# Patient Record
Sex: Female | Born: 1995 | Race: White | Hispanic: No | Marital: Single | Smoking: Former smoker
Health system: Southern US, Community
[De-identification: ages and names within clinical notes are randomized; demographics above are authoritative.]

## PROBLEM LIST (undated history)

## (undated) ENCOUNTER — Ambulatory Visit (HOSPITAL_COMMUNITY): Disposition: A | Discharge status: Home / Self Care | Payer: No Typology Code available for payment source

## (undated) DIAGNOSIS — Z8759 Personal history of other complications of pregnancy, childbirth and the puerperium: Secondary | ICD-10-CM

## (undated) DIAGNOSIS — O139 Gestational [pregnancy-induced] hypertension without significant proteinuria, unspecified trimester: Secondary | ICD-10-CM

## (undated) DIAGNOSIS — R110 Nausea: Secondary | ICD-10-CM

## (undated) DIAGNOSIS — Z87440 Personal history of urinary (tract) infections: Secondary | ICD-10-CM

## (undated) DIAGNOSIS — I10 Essential (primary) hypertension: Secondary | ICD-10-CM

## (undated) DIAGNOSIS — R109 Unspecified abdominal pain: Secondary | ICD-10-CM

## (undated) DIAGNOSIS — D473 Essential (hemorrhagic) thrombocythemia: Secondary | ICD-10-CM

## (undated) HISTORY — DX: Personal history of urinary (tract) infections: Z87.440

## (undated) HISTORY — DX: Essential (hemorrhagic) thrombocythemia: D47.3

## (undated) HISTORY — DX: Personal history of other complications of pregnancy, childbirth and the puerperium: Z87.59

## (undated) HISTORY — DX: Unspecified abdominal pain: R10.9

## (undated) HISTORY — DX: Nausea: R11.0

---
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
## 2017-01-01 ENCOUNTER — Telehealth: Payer: Self-pay | Admitting: Radiology

## 2017-01-01 ENCOUNTER — Other Ambulatory Visit: Payer: 59

## 2017-01-01 LAB — TSH: TSH: 1.874 u[IU]/mL (ref 0.350–4.500)

## 2017-01-01 NOTE — Progress Notes (Signed)
POSTPARTUM PROGRESS NOTE  Post Partum Day 1 Subjective:  Jessicaann B Dillard is a 21 y.o. G1P1001 [redacted]w[redacted]d s/p SVD.  No acute events overnight.  Pt denies problems with ambulating.  She denies nausea or vomiting, but did not have an appetite yesterday, though is feeling hungry now. She has tolerated po fluids well. Pain is well controlled, she is experiencing abdominal cramping but is otherwise well.  She has not had flatus or a bowel movement since giving birth, but has been able to urinate.  Lochia Moderate, per patient this is heavier than her normal menstrual flow.   Objective: Blood pressure 127/69, pulse (!) 111, temperature 98.3 F (36.8 C), temperature source Oral, resp. rate 18, height 5\' 4"  (1.626 m), weight (!) 141.1 kg (311 lb), last menstrual period 03/25/2016, SpO2 99 %, unknown if currently breastfeeding.  Physical Exam:  General: alert, cooperative and no distress Lochia: moderate flow Chest: no respiratory distress Heart:regular rate, distal pulses intact Abdomen: soft, nontender,  Uterine Fundus: firm, appropriately tender DVT Evaluation: No calf swelling or tenderness Extremities: Positive for LE edema  Recent Labs    12/30/16 1114 12/31/16 0625  HGB 10.6* 10.7*  HCT 31.3* 32.5*    Assessment/Plan:  ASSESSMENT: Sirenity B Bubeck is a 21 y.o. G1P1001 [redacted]w[redacted]d s/p SVD who is doing well today. Her heart rate has been elevated throughout admission, which may be secondary to anemia. As the tachycardia was present before delivery, I am less concerned about acute blood loss as the etiology. The patient has a normocytic anemia with normal RDW on CBC, making iron deficiency less likely. On chart review, it appears as though her heart rate has been found to be consistently elevated. We will check a TSH as patient also endorses history of palpitations.   We will plan for the patient to be discharged tomorrow if she continues to do well.    LOS: 1 day   Senaida Lange,  MS3 01/01/2017, 9:12 AM

## 2017-01-01 NOTE — Anesthesia Postprocedure Evaluation (Signed)
Anesthesia Post Note  Patient: Ashley Cisneros  Procedure(s) Performed: AN AD HOC LABOR EPIDURAL     Patient location during evaluation: Mother Baby Anesthesia Type: Epidural Level of consciousness: awake and alert, oriented and patient cooperative Pain management: pain level controlled Vital Signs Assessment: post-procedure vital signs reviewed and stable Respiratory status: spontaneous breathing Cardiovascular status: stable Postop Assessment: no headache, epidural receding, patient able to bend at knees and no signs of nausea or vomiting Anesthetic complications: no Comments: Pain score 4.    Last Vitals:  Vitals:   12/31/16 2351 01/01/17 0531  BP: 126/71 127/69  Pulse: (!) 108 (!) 111  Resp: 18 18  Temp: 36.7 C 36.8 C  SpO2: 99%     Last Pain:  Vitals:   01/01/17 0705  TempSrc:   PainSc: 1    Pain Goal: Patients Stated Pain Goal: 0 (01/01/17 0705)               Rico Sheehan

## 2017-01-01 NOTE — Lactation Note (Signed)
This note was copied from a baby's chart. Lactation Consultation Note  Patient Name: Ashley Cisneros Today's Date: 01/01/2017 Reason for consult: Follow-up assessment;Mother's request;Term   Follow up with mom of 3 hour old infant. Infant with 4 BF , 6 BF attempts, 2 voids and 5 stools in the last 24 hours. Infant weight 7 lb 4.4 oz with 2% weight loss since birth. LATCH scores 6-7.   Mom is concerned with infant feeding. Infant was latched and was noted to be shallowly latched. Mom's nipple was asymmetrical when infant came off. Assisted mom with deepening latch and mom reports it felt much better. Infant fed off and on for 15 minutes and was still feeding when [place] left room. Enc mom to massage/compress breast with feeding. Infant was noted to have intermittent swallows with feeding. Enc mom to use awakening techniques with feeding.   Enc mom to call out for feeding assistance as needed. Mom reports she has no further questions/concerns at this time.    Maternal Data Formula Feeding for Exclusion: No Has patient been taught Hand Expression?: Yes Does the patient have breastfeeding experience prior to this delivery?: No  Feeding Feeding Type: Breast Fed Length of feed: 15 min(Still Bf when LC left room)  LATCH Score Latch: Repeated attempts needed to sustain latch, nipple held in mouth throughout feeding, stimulation needed to elicit sucking reflex.  Audible Swallowing: A few with stimulation  Type of Nipple: Everted at rest and after stimulation  Comfort (Breast/Nipple): Soft / non-tender  Hold (Positioning): Assistance needed to correctly position infant at breast and maintain latch.  LATCH Score: 7  Interventions Interventions: Breast feeding basics reviewed;Support pillows;Assisted with latch;Position options;Skin to skin;Expressed milk;Reverse pressure;Breast massage  Lactation Tools Discussed/Used     Consult Status Consult Status: Follow-up Date:  01/02/17 Follow-up type: In-patient    Debby Freiberg Brick Ketcher 01/01/2017, 4:32 PM

## 2017-01-01 NOTE — Telephone Encounter (Signed)
Left message on the cell phone voicemail about appointments scheduled on 01/07/17 @ 10:00 BP Check and 02/05/16 @ 9:00 Postpartum, patient instructed to call [id number] if times and dates don't work for her.

## 2017-01-01 NOTE — Progress Notes (Signed)
Pt called out says she was bleeding and in pain. RN assessed fundus, WNL. Pt reports passing a golfball sized clot when using the bathroom last time and the blood was a lot heavier than before, "dripping on the floor", per pt. RN did not notice any abnormal finding during additional fundal check. Pt given PRN Tylenol and heat packs for cramping. Pt encouraged to empty bladder often.

## 2017-01-02 MED ORDER — IBUPROFEN 800 MG PO TABS: 800.0000 mg | ORAL_TABLET | Freq: Three times a day (TID) | ORAL | 2 refills | Status: DC | PRN

## 2017-01-02 MED ORDER — FERROUS FUMARATE 325 (106 FE) MG PO TABS: 1.0000 | ORAL_TABLET | Freq: Two times a day (BID) | ORAL | 2 refills | Status: DC

## 2017-01-02 MED ORDER — POLYSACCHARIDE IRON COMPLEX 150 MG PO CAPS
150.0000 mg | ORAL_CAPSULE | Freq: Two times a day (BID) | ORAL | Status: DC
  Filled 2017-01-02: qty 1

## 2017-01-02 NOTE — Lactation Note (Signed)
This note was copied from a baby's chart. Lactation Consultation Note  Patient Name: Ashley Cisneros Today's Date: 01/02/2017  Baby at a 7% weight loss.  Mom c/o sore nipples.  Nipples pink and intact.  Mom may want to rest nipples because baby has been cluster feeding.  Discussed formula and volume parameters if she decides to supplement some feeds.  She has a DEBP at home.  Stressed importance of pumping if she supplements.  Comfort gels given with instructions.  Mom is motivated.  Hopefully nipples will improve as milk comes to volume.  Lactation outpatient services and support information reviewed and encouraged.   Maternal Data    Feeding    LATCH Score                   Interventions    Lactation Tools Discussed/Used     Consult Status      Ave Filter 01/02/2017, 8:13 AM

## 2017-01-02 NOTE — Progress Notes (Signed)
Post Partum Day #2 Subjective: no complaints, up ad lib, voiding, tolerating PO and + flatus  Objective: Blood pressure 138/84, pulse (!) 101, temperature 98.1 F (36.7 C), temperature source Oral, resp. rate 18, height 5\' 4"  (1.626 m), weight (!) 311 lb (141.1 kg), last menstrual period 03/25/2016, SpO2 99 %, unknown if currently breastfeeding.  Physical Exam:  General: alert, cooperative and no distress Lochia: appropriate Uterine Fundus: firm Incision: none DVT Evaluation: No evidence of DVT seen on physical exam. No cords or calf tenderness. No significant calf/ankle edema.  Recent Labs    12/30/16 1114 12/31/16 0625  HGB 10.6* 10.7*  HCT 31.3* 32.5*    Assessment/Plan: Discharge home, Breastfeeding and Contraception POP or Nexplanon; does not want in hospital.    Mild anemia: iron started.    LOS: 2 days   [name], CNM 01/02/2017, 5:56 AM

## 2017-01-02 NOTE — Discharge Summary (Signed)
OB Discharge Summary     Patient Name: Ashley Cisneros DOB: 1995/11/12 MRN: [id number]  Date of admission: 12/31/2016 Delivering MD: Senaida Lange   Date of discharge: 01/02/2017  Admitting diagnosis: 68 WEEKS CTX Intrauterine pregnancy: [redacted]w[redacted]d     Secondary diagnosis:  Active Problems:   Obesity in pregnancy   Supervision of other normal pregnancy, antepartum   Gonorrhea affecting pregnancy in third trimester   Gestational hypertension  Additional problems: none     Discharge diagnosis: Term Pregnancy Delivered                                                                                                Post partum procedures:none  Augmentation: AROM and Pitocin  Complications: None  Hospital course:  Onset of Labor With Vaginal Delivery     21 y.o. yo G1P1001 at [redacted]w[redacted]d was admitted in Active Labor on 12/31/2016. Sent from office on 12/30/16 for IOL on 12/31/16, found to be in labor. Patient had an uncomplicated labor course as follows:  Membrane Rupture Time/Date: 10:03 AM ,12/31/2016   Intrapartum Procedures: Episiotomy: None [1]                                         Lacerations:  2nd degree [3]  Patient had a delivery of a Viable infant. 12/31/2016  Information for the patient's newborn:  Ashley, [place] Girl Cisneros [[id number]]  Delivery Method: Vaginal, Spontaneous(Filed from Delivery Summary)    Pateint had an uncomplicated postpartum course.  She is ambulating, tolerating a regular diet, passing flatus, and urinating well. Patient is discharged home in stable condition on 01/02/17.   Physical exam  Vitals:   12/31/16 1938 12/31/16 2351 01/01/17 0531 01/01/17 1915  BP: 138/71 126/71 127/69 138/84  Pulse: (!) 125 (!) 108 (!) 111 (!) 101  Resp: 18 18 18 18   Temp: 98.2 F (36.8 C) 98.1 F (36.7 C) 98.3 F (36.8 C) 98.1 F (36.7 C)  TempSrc: Oral Oral Oral Oral  SpO2: 100% 99%    Weight:      Height:       General: alert, cooperative and no distress Lochia:  appropriate Uterine Fundus: firm Incision: N/A DVT Evaluation: No evidence of DVT seen on physical exam. Labs: Lab Results  Component Value Date   WBC 13.7 (H) 12/31/2016   HGB 10.7 (L) 12/31/2016   HCT 32.5 (L) 12/31/2016   MCV 82.5 12/31/2016   PLT 334 12/31/2016   CMP Latest Ref Rng & Units 12/31/2016  Glucose 65 - 99 mg/dL 98  BUN 6 - 20 mg/dL 9  Creatinine 0.44 - 1.00 mg/dL 0.52  Sodium 135 - 145 mmol/L 134(L)  Potassium 3.5 - 5.1 mmol/L 3.8  Chloride 101 - 111 mmol/L 105  CO2 22 - 32 mmol/L 18(L)  Calcium 8.9 - 10.3 mg/dL 9.3  Total Protein 6.5 - 8.1 g/dL 6.3(L)  Total Bilirubin 0.3 - 1.2 mg/dL 0.5  Alkaline Phos 38 - 126 U/L 123  AST 15 - 41 U/L 19  ALT 14 - 54 U/L 13(L)    Discharge instruction: per After Visit Summary and "Baby and Me Booklet".  After visit meds:  Allergies as of 01/02/2017   No Known Allergies     Medication List    TAKE these medications   ferrous fumarate 325 (106 Fe) MG Tabs tablet Commonly known as:  HEMOCYTE - 106 mg FE Take 1 tablet (106 mg of iron total) by mouth 2 (two) times daily.   ibuprofen 800 MG tablet Commonly known as:  ADVIL,MOTRIN Take 1 tablet (800 mg total) by mouth every 8 (eight) hours as needed.   multivitamin-prenatal 27-0.8 MG Tabs tablet Take 1 tablet by mouth daily at 12 noon.       Diet: No evidence of DVT seen on physical exam.  Activity: Advance as tolerated. Pelvic rest for 6 weeks.   Outpatient follow up:4 weeks Follow up Appt: Future Appointments  Date Time Provider [place]  01/07/2017 10:00 AM [facility] NURSE [facility]  02/04/2017  9:00 AM Donnamae Jude, MD [facility]   Follow up Visit:No Follow-up on file.  Postpartum contraception: Progesterone only pills, Nexplanon and Undecided  Newborn Data: Live born female  Birth Weight: 7 lb 6.5 oz (3360 g) APGAR: 8, 9  Newborn Delivery   Birth date/time:  12/31/2016 15:53:00 Delivery type:  Vaginal,  Spontaneous     Baby Feeding: Breast Disposition:home with mother   01/02/2017 Ashley Cisneros, CNM

## 2017-01-06 ENCOUNTER — Inpatient Hospital Stay (HOSPITAL_COMMUNITY): Payer: 59

## 2017-01-07 ENCOUNTER — Ambulatory Visit: Payer: 59 | Admitting: *Deleted

## 2017-01-07 DIAGNOSIS — R030 Elevated blood-pressure reading, without diagnosis of hypertension: Secondary | ICD-10-CM

## 2017-01-07 NOTE — Progress Notes (Signed)
Subjective:  Ashley Cisneros is a 21 y.o. female with hypertension. Current Outpatient Medications  Medication Sig Dispense Refill  . ferrous fumarate (HEMOCYTE - 106 MG FE) 325 (106 Fe) MG TABS tablet Take 1 tablet (106 mg of iron total) by mouth 2 (two) times daily. 60 each 2  . ibuprofen (ADVIL,MOTRIN) 800 MG tablet Take 1 tablet (800 mg total) by mouth every 8 (eight) hours as needed. 90 tablet 2  . Prenatal Vit-Fe Fumarate-FA (MULTIVITAMIN-PRENATAL) 27-0.8 MG TABS tablet Take 1 tablet by mouth daily at 12 noon.     No current facility-administered medications for this visit.     Hypertension ROS: taking medications as instructed, no medication side effects noted, no TIA's, no chest pain on exertion, no dyspnea on exertion and no swelling of ankles.     Objective:  There were no vitals taken for this visit.  Appearance alert, well appearing, and in no distress. General exam BP noted to be well controlled today in office.    Assessment:   Hypertension well controlled.   Plan:  Current treatment plan is effective, no change in therapy.Marland Kitchen

## 2017-02-04 ENCOUNTER — Ambulatory Visit (INDEPENDENT_AMBULATORY_CARE_PROVIDER_SITE_OTHER): Payer: 59 | Admitting: Family Medicine

## 2017-02-04 ENCOUNTER — Encounter: Payer: Self-pay | Admitting: Family Medicine

## 2017-02-04 DIAGNOSIS — Z1389 Encounter for screening for other disorder: Secondary | ICD-10-CM

## 2017-02-04 NOTE — Progress Notes (Signed)
Subjective:     Ashley Cisneros is a 22 y.o. female who presents for a postpartum visit. She is five weeks postpartum following a vaginal delivery. I have fully reviewed the prenatal and intrapartum course. The delivery was at 40 gestational weeks. Outcome: vaginal. Anesthesia: epidual. Postpartum course has been uncompliated. Baby's course has been uncomplicated. Baby is feeding by bottle. Bleeding some spotting. Bowel function is normal. Bladder function is normal. Patient is sexually active. Contraception method is nothing at this time. Postpartum depression screening: negative  Review of Systems Pertinent items noted in HPI and remainder of comprehensive ROS otherwise negative.   Objective:    There were no vitals taken for this visit.  General:  alert, cooperative and appears stated age  Lungs: normal effort  Heart:  regular rate and rhythm  Abdomen: soft, non-tender; bowel sounds normal; no masses,  no organomegaly        Assessment:    Nml postpartum exam. Pap smear not done at today's visit.   Plan:    1. Contraception: IUD 2. No pap due to age 76. Follow up in: 2 weeks for IUD insertion or as needed.

## 2017-02-17 ENCOUNTER — Ambulatory Visit: Payer: 59 | Admitting: Obstetrics and Gynecology

## 2017-02-17 DIAGNOSIS — Z30430 Encounter for insertion of intrauterine contraceptive device: Secondary | ICD-10-CM

## 2017-06-10 ENCOUNTER — Ambulatory Visit (INDEPENDENT_AMBULATORY_CARE_PROVIDER_SITE_OTHER): Payer: No Typology Code available for payment source | Admitting: Obstetrics and Gynecology

## 2017-06-10 ENCOUNTER — Encounter: Payer: Self-pay | Admitting: Obstetrics and Gynecology

## 2017-06-10 VITALS — BP 112/84 | HR 72 | Wt 309.5 lb

## 2017-06-10 DIAGNOSIS — Z3009 Encounter for other general counseling and advice on contraception: Secondary | ICD-10-CM

## 2017-06-10 DIAGNOSIS — N939 Abnormal uterine and vaginal bleeding, unspecified: Secondary | ICD-10-CM | POA: Diagnosis not present

## 2017-06-10 DIAGNOSIS — Z6841 Body Mass Index (BMI) 40.0 and over, adult: Secondary | ICD-10-CM

## 2017-06-10 NOTE — Progress Notes (Signed)
Would like to dicsuss nexplanon

## 2017-06-10 NOTE — Progress Notes (Signed)
Obstetrics and Gynecology Established Patient Evaluation  Appointment Date: 06/10/2017  OBGYN Clinic: [facility]  Primary Care Provider: Erma Pinto  Referring Provider: Erma Pinto, MD  Chief Complaint:  Chief Complaint  Patient presents with  . Discuss Birth Control    History of Present Illness: Ashley Cisneros is a 22 y.o. Caucasian G1P1001 (Patient's last menstrual period was 05/31/2017.), seen for the above chief complaint. Her past medical history is significant for BMI 50s.  Last intercourse 5 days ago. Pt interested in [facility].    Review of Systems:  as noted in the History of Present Illness.  Past Medical History:  Past Medical History:  Diagnosis Date  . Abdominal pain, recurrent   . Hypertension    last two weeks  . Nausea   . Pregnancy induced hypertension    for two weeks  . Thrombocytosis ([place]) 05/26/2016   Overview:  Send to [place] for thrombocytosis     Past Surgical History:  Past Surgical History:  Procedure Laterality Date  . ADENOIDECTOMY    . TONSILLECTOMY      Past Obstetrical History:  OB History  Gravida Para Term Preterm AB Living  1 1 1  0 0 1  SAB TAB Ectopic Multiple Live Births  0 0 0 0 1    # Outcome Date GA Lbr Len/2nd Weight Sex Delivery Anes PTL Lv  1 Term 12/31/16 [redacted]w[redacted]d 19:09 / 00:44 7 lb 6.5 oz (3.36 kg) F Vag-Spont EPI  LIV     Birth Comments: caput    Past Gynecological History: As per HPI. Periods: q2wks 3-4d and not has heavy and painful as they were prior to her pregnancy History of Pap Smear(s): n/a due to age She is currently using none for contraception.   Social History:  Social History   Socioeconomic History  . Marital status: Single    Spouse name: Not on file  . Number of children: Not on file  . Years of education: Not on file  . Highest education level: Not on file  Occupational History  . Not on file  Social Needs  . Financial resource strain: Not on file  . Food insecurity:     Worry: Not on file    Inability: Not on file  . Transportation needs:    Medical: Not on file    Non-medical: Not on file  Tobacco Use  . Smoking status: Former Research scientist (life sciences)  . Smokeless tobacco: Never Used  Substance and Sexual Activity  . Alcohol use: No  . Drug use: No  . Sexual activity: Yes    Partners: Male  Lifestyle  . Physical activity:    Days per week: Not on file    Minutes per session: Not on file  . Stress: Not on file  Relationships  . Social connections:    Talks on phone: Not on file    Gets together: Not on file    Attends religious service: Not on file    Active member of club or organization: Not on file    Attends meetings of clubs or organizations: Not on file    Relationship status: Not on file  . Intimate partner violence:    Fear of current or ex partner: Not on file    Emotionally abused: Not on file    Physically abused: Not on file    Forced sexual activity: Not on file  Other Topics Concern  . Not on file  Social History Narrative   7th  grade    Family History:  Family History  Problem Relation Age of Onset  . Ulcers Paternal Grandmother   . Ovarian cancer Paternal Grandmother 71  . Hypertension Mother   . Diabetes Maternal Grandmother   . Diabetes Paternal Grandfather     Medications None  Allergies Patient has no known allergies.   Physical Exam:  BP 112/84   Pulse 72   Wt (!) 309 lb 8 oz (140.4 kg)   LMP 05/31/2017   BMI 53.13 kg/m  Body mass index is 53.13 kg/m. General appearance: Well nourished, well developed female in no acute distress.   Laboratory: none  Radiology: none  Assessment: pt doing well  Plan:  1. Birth control counseling Options d/w pt and she would like to do nexplanon. RTC 10-14d for UPT and nexplanon placement  2. Abnormal uterine bleeding (AUB) Will get basic blood work and set up for early cycle u/s. May need endosee evaluation.  - CBC - Prolactin - TSH - Hemoglobin A1c - Beta hCG  quant (ref lab) - US PELVIC COMPLETE WITH TRANSVAGINAL; Future  3. BMI 50.0-59.9, adult ([place]) D/w pt and she would not like to see nutrition, etc for this.   RTC 2wks  Durene Romans MD Attending [facility] for [facility])

## 2017-06-11 LAB — TSH: TSH: 2.21 u[IU]/mL (ref 0.450–4.500)

## 2017-06-11 LAB — CBC
HEMATOCRIT: 38 % (ref 34.0–46.6)
Hemoglobin: 12.2 g/dL (ref 11.1–15.9)
MCH: 23.6 pg — AB (ref 26.6–33.0)
MCHC: 32.1 g/dL (ref 31.5–35.7)
MCV: 73 fL — ABNORMAL LOW (ref 79–97)
Platelets: 426 10*3/uL — ABNORMAL HIGH (ref 150–379)
RBC: 5.18 x10E6/uL (ref 3.77–5.28)
RDW: 18.8 % — AB (ref 12.3–15.4)
WBC: 10.5 10*3/uL (ref 3.4–10.8)

## 2017-06-11 LAB — HEMOGLOBIN A1C
Est. average glucose Bld gHb Est-mCnc: 108 mg/dL
Hgb A1c MFr Bld: 5.4 % (ref 4.8–5.6)

## 2017-06-11 LAB — PROLACTIN: PROLACTIN: 6.9 ng/mL (ref 4.8–23.3)

## 2017-06-11 LAB — BETA HCG QUANT (REF LAB): hCG Quant: <1 m[IU]/mL

## 2017-06-23 ENCOUNTER — Ambulatory Visit (HOSPITAL_COMMUNITY): Payer: No Typology Code available for payment source

## 2017-06-25 ENCOUNTER — Ambulatory Visit: Payer: No Typology Code available for payment source | Admitting: Obstetrics and Gynecology

## 2017-06-25 DIAGNOSIS — Z30017 Encounter for initial prescription of implantable subdermal contraceptive: Secondary | ICD-10-CM

## 2017-06-30 ENCOUNTER — Ambulatory Visit (HOSPITAL_COMMUNITY): Payer: No Typology Code available for payment source | Attending: Obstetrics and Gynecology

## 2017-07-08 ENCOUNTER — Ambulatory Visit: Payer: No Typology Code available for payment source | Admitting: Obstetrics and Gynecology

## 2017-07-08 DIAGNOSIS — Z09 Encounter for follow-up examination after completed treatment for conditions other than malignant neoplasm: Secondary | ICD-10-CM

## 2017-07-09 ENCOUNTER — Encounter: Payer: Self-pay | Admitting: Obstetrics and Gynecology

## 2017-07-09 NOTE — Progress Notes (Signed)
Patient did not keep GYN appointment for 07/08/2017.  Durene Romans MD Attending Center for [facility])

## 2017-08-12 ENCOUNTER — Other Ambulatory Visit: Payer: No Typology Code available for payment source

## 2017-09-07 ENCOUNTER — Ambulatory Visit (INDEPENDENT_AMBULATORY_CARE_PROVIDER_SITE_OTHER): Payer: No Typology Code available for payment source

## 2017-09-07 VITALS — BP 126/83 | HR 63

## 2017-09-07 DIAGNOSIS — N898 Other specified noninflammatory disorders of vagina: Secondary | ICD-10-CM

## 2017-09-07 DIAGNOSIS — Z113 Encounter for screening for infections with a predominantly sexual mode of transmission: Secondary | ICD-10-CM | POA: Diagnosis not present

## 2017-09-07 NOTE — Progress Notes (Signed)
SUBJECTIVE:  22 y.o. female complains of vaginal discharge for a couple of days. Denies abnormal vaginal bleeding or significant pelvic pain or fever. No UTI symptoms. Denies history of known exposure to STD.  No LMP recorded.  OBJECTIVE:  She appears well, afebrile. Urine dipstick: not done today ASSESSMENT:  Vaginal Discharge small amount Vaginal Odor yes   PLAN:  GC, chlamydia, trichomonas, BVAG, CVAG probe sent to lab. Treatment: To be determined once lab results are received ROV prn if symptoms persist or worsen.

## 2017-09-08 LAB — CERVICOVAGINAL ANCILLARY ONLY
BACTERIAL VAGINITIS: NEGATIVE
Candida vaginitis: POSITIVE — AB
Chlamydia: NEGATIVE
NEISSERIA GONORRHEA: NEGATIVE
TRICH (WINDOWPATH): NEGATIVE

## 2017-09-08 MED ORDER — FLUCONAZOLE 150 MG PO TABS: 150.0000 mg | ORAL_TABLET | Freq: Every day | ORAL | 2 refills | Status: DC

## 2017-10-07 ENCOUNTER — Ambulatory Visit (INDEPENDENT_AMBULATORY_CARE_PROVIDER_SITE_OTHER): Payer: No Typology Code available for payment source

## 2017-10-07 DIAGNOSIS — N898 Other specified noninflammatory disorders of vagina: Secondary | ICD-10-CM | POA: Diagnosis not present

## 2017-10-07 DIAGNOSIS — Z113 Encounter for screening for infections with a predominantly sexual mode of transmission: Secondary | ICD-10-CM | POA: Diagnosis not present

## 2017-10-07 NOTE — Progress Notes (Signed)
SUBJECTIVE:  22 y.o. female complains of vag discharge for a couple of days. Denies abnormal vaginal bleeding or significant pelvic pain or fever. No UTI symptoms. Denies history of known exposure to STD.  No LMP recorded.  OBJECTIVE:  She appears well, afebrile. Urine dipstick: not done today  ASSESSMENT:  Vaginal Discharge: none  Vaginal Odor: none   PLAN:  GC, chlamydia, trichomonas, BVAG, CVAG probe sent to lab. Treatment: To be determined once lab results are received ROV prn if symptoms persist or worsen.

## 2017-10-11 LAB — CERVICOVAGINAL ANCILLARY ONLY
BACTERIAL VAGINITIS: NEGATIVE
Candida vaginitis: POSITIVE — AB
Chlamydia: NEGATIVE
NEISSERIA GONORRHEA: NEGATIVE
Trichomonas: NEGATIVE

## 2017-10-12 ENCOUNTER — Other Ambulatory Visit: Payer: Self-pay | Admitting: Obstetrics & Gynecology

## 2017-10-12 DIAGNOSIS — N898 Other specified noninflammatory disorders of vagina: Secondary | ICD-10-CM

## 2017-10-12 DIAGNOSIS — B373 Candidiasis of vulva and vagina: Secondary | ICD-10-CM

## 2017-10-12 DIAGNOSIS — B3731 Acute candidiasis of vulva and vagina: Secondary | ICD-10-CM

## 2017-10-12 MED ORDER — FLUCONAZOLE 150 MG PO TABS: 150.0000 mg | ORAL_TABLET | Freq: Every day | ORAL | 2 refills | Status: DC

## 2017-12-29 ENCOUNTER — Encounter (INDEPENDENT_AMBULATORY_CARE_PROVIDER_SITE_OTHER): Payer: Self-pay

## 2017-12-29 ENCOUNTER — Ambulatory Visit (INDEPENDENT_AMBULATORY_CARE_PROVIDER_SITE_OTHER): Payer: No Typology Code available for payment source | Admitting: Primary Care

## 2017-12-29 ENCOUNTER — Encounter: Payer: Self-pay | Admitting: Primary Care

## 2017-12-29 ENCOUNTER — Encounter: Payer: Self-pay | Admitting: *Deleted

## 2017-12-29 DIAGNOSIS — F329 Major depressive disorder, single episode, unspecified: Secondary | ICD-10-CM | POA: Insufficient documentation

## 2017-12-29 DIAGNOSIS — F32A Depression, unspecified: Secondary | ICD-10-CM

## 2017-12-29 DIAGNOSIS — F419 Anxiety disorder, unspecified: Secondary | ICD-10-CM

## 2017-12-29 MED ORDER — FLUOXETINE HCL 20 MG PO TABS: 20.0000 mg | ORAL_TABLET | Freq: Every day | ORAL | 1 refills | Status: DC

## 2017-12-29 NOTE — Patient Instructions (Addendum)
Start fluoxetine 20 mg tablets for anxiety and depression. Start by taking 1/2 tablet daily for 8 days, then increase to 1 full tablet thereafter.  You will be contacted regarding your referral to therapy.  Please let us know if you have not been contacted within one week.   Schedule a follow up visit in 6 weeks for re-evaluation.  It was a pleasure to meet you today! Please don't hesitate to call or message me with any questions. Welcome to [facility]!

## 2017-12-29 NOTE — Progress Notes (Signed)
Subjective:    Patient ID: Ashley Cisneros, female    DOB: [birth year], 22 y.o.   MRN: [id number]  HPI  Ms. Ashley Cisneros is a 22 year old female who presents today to establish care and discuss the problems mentioned below. Will obtain old records.  1) Anxiety and Depression: No prior diagnosis or symptoms. She is post partum one year ago and over the last 5-6 months she's noticed feeling anxious and depressed. Symptoms include little motivation to do anything, doesn't want to leave her house, feeling down, daily worry, sleeping a lot. Her symptoms have progressed. PHQ 9 score of 18 and GAD 7 score of 15 today. She denies SI/HI.   She is not breast feeding.   BP Readings from Last 3 Encounters:  12/29/17 106/64  09/07/17 126/83  06/10/17 112/84     Review of Systems  Respiratory: Negative for shortness of breath.   Cardiovascular: Negative for chest pain.  Endocrine: Negative for cold intolerance and heat intolerance.  Psychiatric/Behavioral:       See HPI       Past Medical History:  Diagnosis Date  . Abdominal pain, recurrent   . History of UTI   . Hypertension    last two weeks  . Nausea   . Pregnancy induced hypertension    for two weeks  . Thrombocytosis ([place]) 05/26/2016   Overview:  Send to [place] for thrombocytosis      Social History   Socioeconomic History  . Marital status: Single    Spouse name: Not on file  . Number of children: Not on file  . Years of education: Not on file  . Highest education level: Not on file  Occupational History  . Not on file  Social Needs  . Financial resource strain: Not on file  . Food insecurity:    Worry: Not on file    Inability: Not on file  . Transportation needs:    Medical: Not on file    Non-medical: Not on file  Tobacco Use  . Smoking status: Former Research scientist (life sciences)  . Smokeless tobacco: Never Used  Substance and Sexual Activity  . Alcohol use: No  . Drug use: No  . Sexual activity: Yes    Partners: Male  Lifestyle    . Physical activity:    Days per week: Not on file    Minutes per session: Not on file  . Stress: Not on file  Relationships  . Social connections:    Talks on phone: Not on file    Gets together: Not on file    Attends religious service: Not on file    Active member of club or organization: Not on file    Attends meetings of clubs or organizations: Not on file    Relationship status: Not on file  . Intimate partner violence:    Fear of current or ex partner: Not on file    Emotionally abused: Not on file    Physically abused: Not on file    Forced sexual activity: Not on file  Other Topics Concern  . Not on file  Social History Narrative   Single.   1 child.   Works at [employer].   Enjoys spending time with her daughter.     Past Surgical History:  Procedure Laterality Date  . TONSILLECTOMY AND ADENOIDECTOMY  2006    Family History  Problem Relation Age of Onset  . Ulcers Paternal Grandmother   . Ovarian cancer Paternal Grandmother  26  . Hypertension Mother   . Diabetes Maternal Grandmother   . Diabetes Paternal Grandfather     No Known Allergies  No current outpatient medications on file prior to visit.   No current facility-administered medications on file prior to visit.     BP 106/64   Pulse 88   Temp 98.1 F (36.7 C) (Oral)   Ht 5\' 5"  (1.651 m)   Wt (!) 325 lb 8 oz (147.6 kg)   LMP 12/15/2017   SpO2 98%   BMI 54.17 kg/m    Objective:   Physical Exam  Constitutional: She appears well-nourished.  Neck: Neck supple.  Cardiovascular: Normal rate and regular rhythm.  Respiratory: Effort normal and breath sounds normal.  Skin: Skin is warm and dry.  Psychiatric: She has a normal mood and affect.  Tearful during exam at times           Assessment & Plan:

## 2017-12-29 NOTE — Assessment & Plan Note (Signed)
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 Symptoms x 5-6 months and progressing. PHQ 9 score of 18 and GAD 7 score of 15 today.   Labs including TSH and A1C reviewed in Epic from May 2019 which are unremarkable.   Discussed different options for treatment including therapy and medication, she opts for both. Referral placed for therapy. Rx for fluoxetine 20 mg sent to pharmacy, she is not breast feeding.   Patient is to take 1/2 tablet daily for 8 days, then advance to 1 full tablet thereafter. We discussed possible side effects of headache, GI upset, drowsiness, and SI/HI. If thoughts of SI/HI develop, we discussed to present to the emergency immediately. Patient verbalized understanding.   Follow up in 6 weeks for re-evaluation.

## 2018-01-12 ENCOUNTER — Telehealth: Payer: Self-pay | Admitting: Primary Care

## 2018-01-12 DIAGNOSIS — F419 Anxiety disorder, unspecified: Principal | ICD-10-CM

## 2018-01-12 DIAGNOSIS — F329 Major depressive disorder, single episode, unspecified: Secondary | ICD-10-CM

## 2018-01-12 MED ORDER — SERTRALINE HCL 25 MG PO TABS: 25.0000 mg | ORAL_TABLET | Freq: Every day | ORAL | 1 refills | Status: AC

## 2018-01-12 NOTE — Addendum Note (Signed)
Addended by: Pleas Koch on: 01/12/2018 04:32 PM   Modules accepted: Orders

## 2018-01-12 NOTE — Telephone Encounter (Signed)
Pt is requesting a call to speak about the fluoxetine she is taking. Pt states she is feeling worse since starting the medication.

## 2018-01-12 NOTE — Telephone Encounter (Signed)
Please call patient and notify her to stop fluoxetine.  I sent in a medication sertraline (Zoloft) 25 mg.  Start with 1/2 tablet daily for 6 days then increase to 1 full tablet thereafter.  Have her follow-up with me as scheduled or call sooner if she has any problems.

## 2018-01-12 NOTE — Telephone Encounter (Signed)
Spoken to patient and she stated that 3-4 days after taking meds, she feels emotions have heighten. She even had a panic attack which did not happen before. She really feels worse after taking fluoxetine. Patient ask if Allie Bossier can send something else.

## 2018-01-12 NOTE — Telephone Encounter (Signed)
Spoken and notified patient of Kate Clark's comments. Patient verbalized understanding.  

## 2018-02-09 ENCOUNTER — Ambulatory Visit: Payer: No Typology Code available for payment source | Admitting: Primary Care

## 2018-02-09 DIAGNOSIS — Z0289 Encounter for other administrative examinations: Secondary | ICD-10-CM

## 2018-05-15 IMAGING — US US MFM OB DETAIL+14 WK
1 series · 14 of 28 positions shown · non-contrast
Comparison: none

[Series 1: us mfm ob detail+14 wk · 14 of 50 slices shown]
[im 2/50]
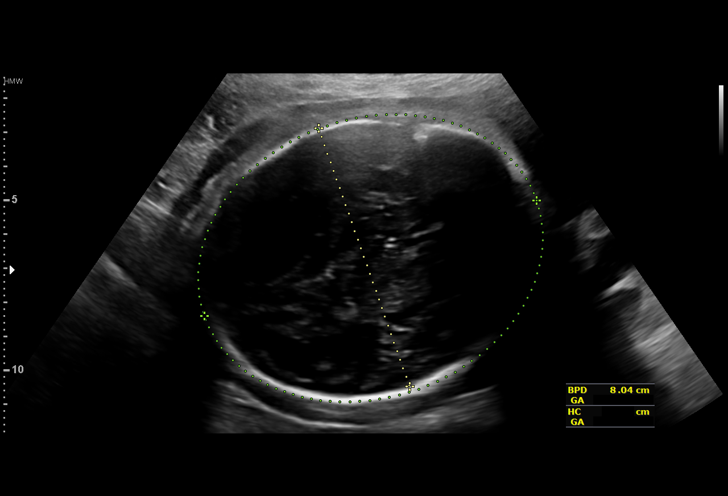
[im 6/50]
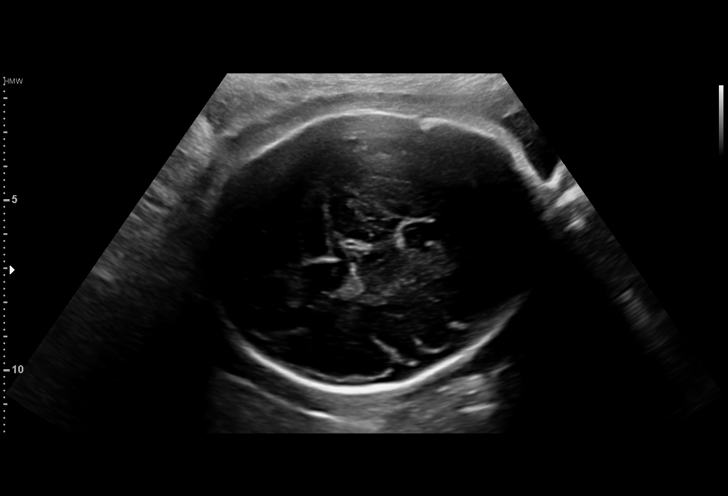
[im 10/50]
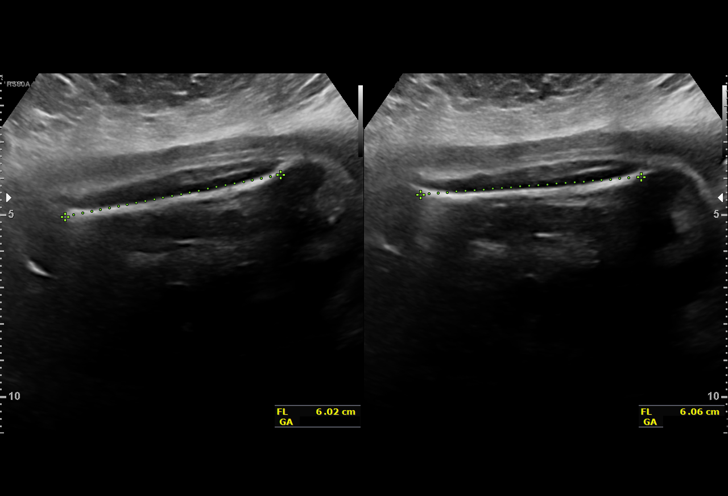
[im 13/50]
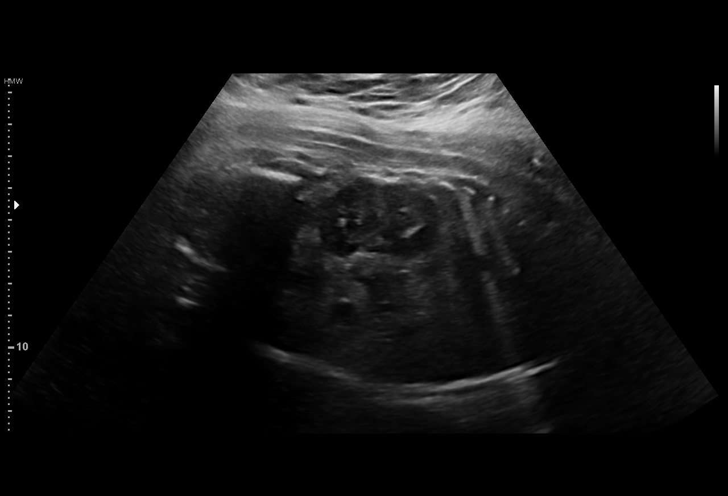
[im 17/50]
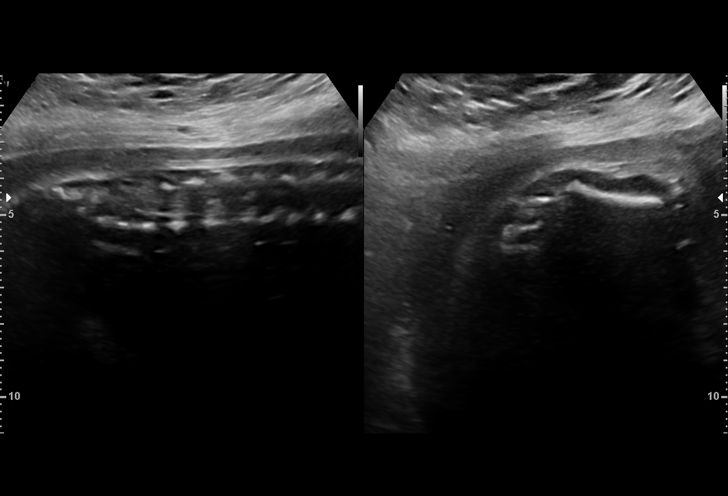
[im 20/50]
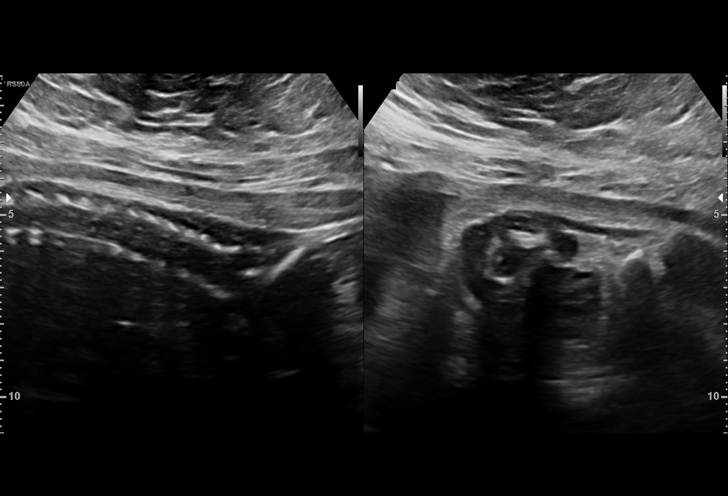
[im 24/50]
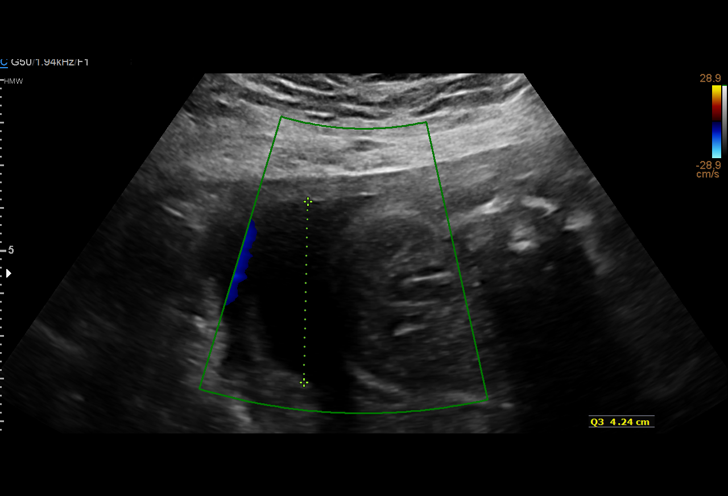
[im 28/50]
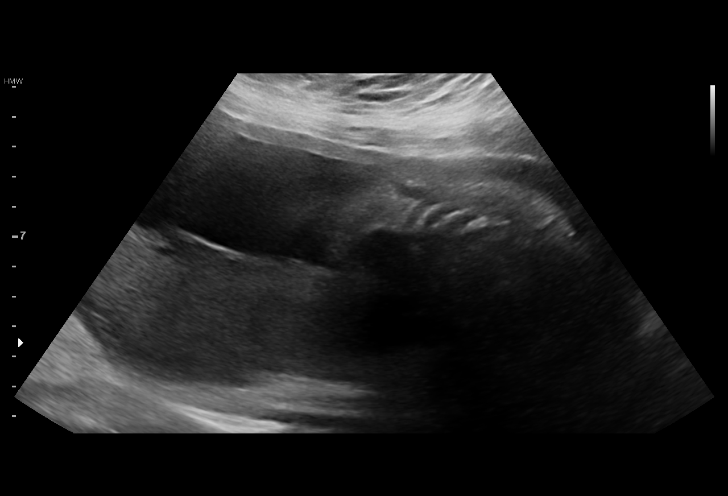
[im 31/50]
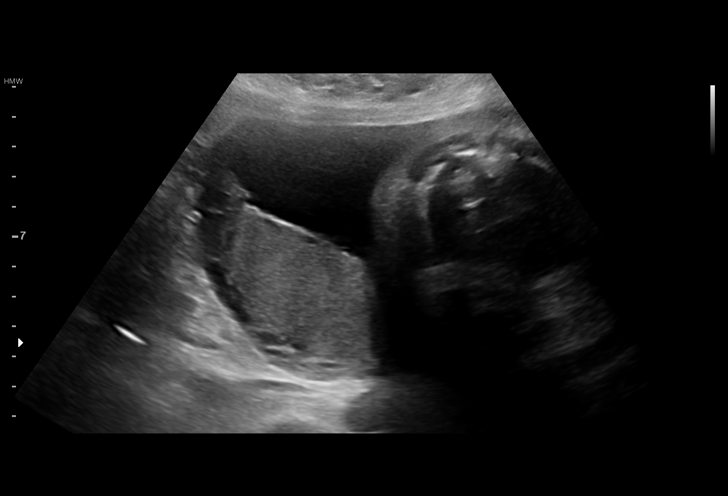
[im 35/50]
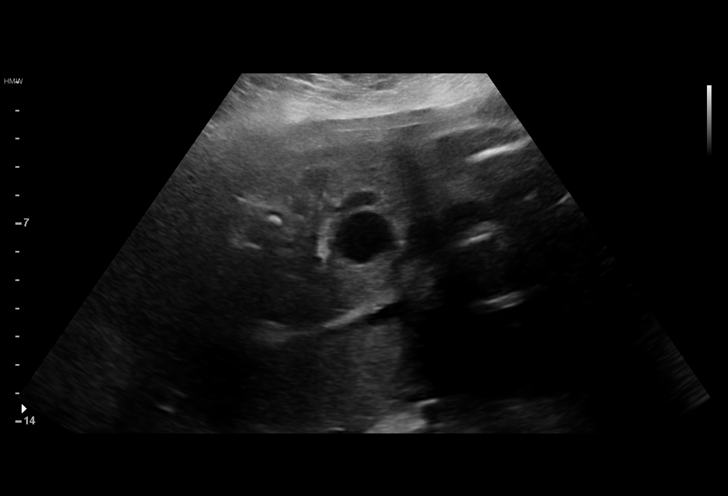
[im 39/50]
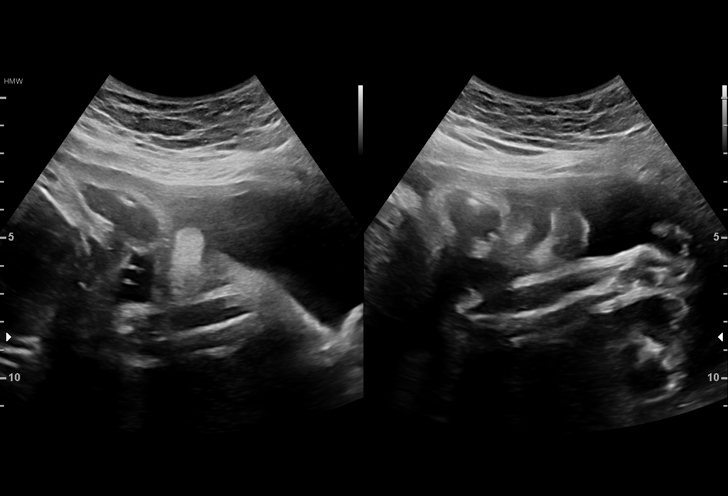
[im 42/50]
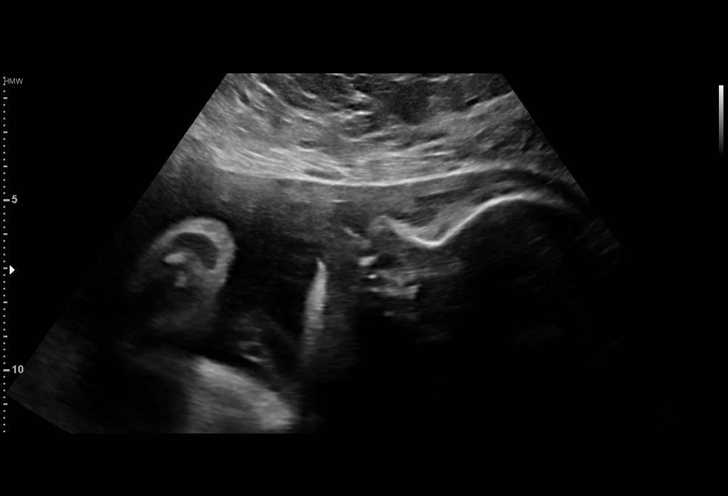
[im 46/50]
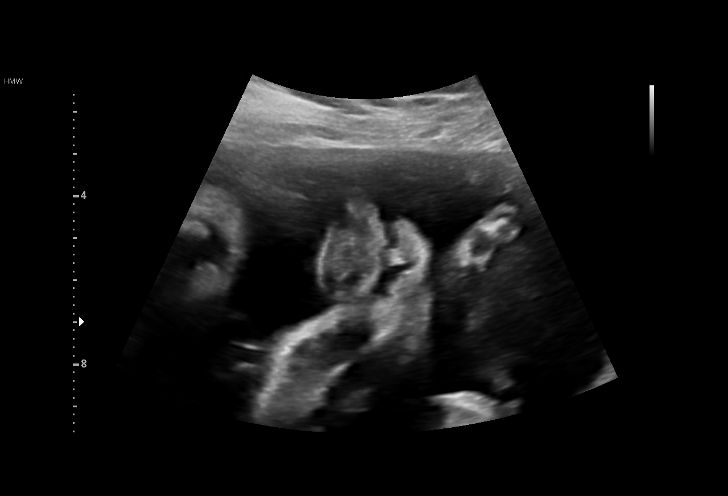
[im 50/50]
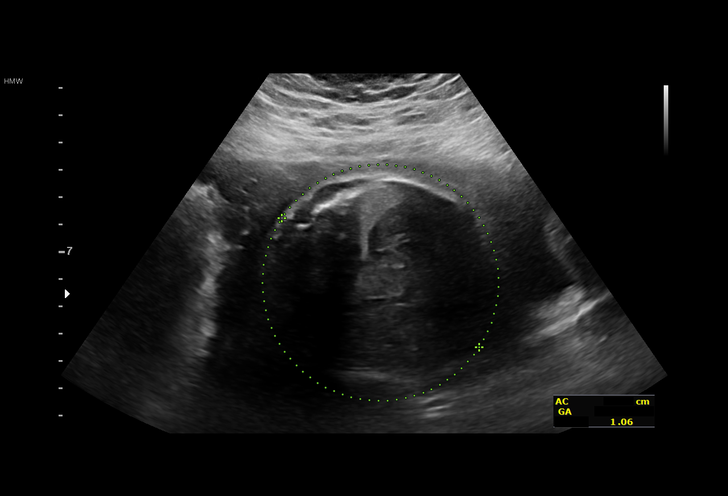

[14 of 28 positions shown; findings below may reference images not displayed]

1  LEBENE MEISU              [id number]      [id number]     [id number]
Indications

31 weeks gestation of pregnancy
Maternal morbid obesity
Encounter for antenatal screening, growth
OB History

Blood Type:            Height:  5'4"   Weight (lb):  293      BMI:
Gravidity:    1         Term:   0        Prem:   0        SAB:   0
TOP:          0       Ectopic:  0        Living: 0
Fetal Evaluation

Num Of Fetuses:     1
Fetal Heart         170
Rate(bpm):
Cardiac Activity:   Observed
Presentation:       Cephalic
Placenta:           Posterior, above cervical os
P. Cord Insertion:  Not well visualized

Amniotic Fluid
AFI FV:      Subjectively within normal limits

AFI Sum(cm)     %Tile       Largest Pocket(cm)
12.01           31

RUQ(cm)       RLQ(cm)       LUQ(cm)        LLQ(cm)
2.99
Biometry
BPD:      80.5  mm     G. Age:  32w 2d         60  %    CI:        77.15   %   70 - 86
FL/HC:      20.8   %   19.1 -
HC:      290.2  mm     G. Age:  32w 0d         21  %    HC/AC:      1.07       0.96 -
AC:      271.7  mm     G. Age:  31w 2d         35  %    FL/BPD:     75.0   %   71 - 87
FL:       60.4  mm     G. Age:  31w 3d         29  %    FL/AC:      22.2   %   20 - 24
HUM:        54  mm     G. Age:  31w 3d         45  %

Est. FW:    7117  gm    3 lb 14 oz      51  %
Gestational Age

LMP:           31w 5d       Date:   03/25/16                 EDD:   12/30/16
U/S Today:     31w 5d                                        EDD:   12/30/16
Best:          31w 5d    Det. By:   LMP  (03/25/16)          EDD:   12/30/16
Anatomy

Cranium:               Appears normal         Aortic Arch:            Not well visualized
Cavum:                 Appears normal         Ductal Arch:            Not well visualized
Ventricles:            Appears normal         Diaphragm:              Not well visualized
Choroid Plexus:        Not well visualized    Stomach:                Appears normal, left
sided
Cerebellum:            Not well visualized    Abdomen:                Appears normal
Posterior Fossa:       Not well visualized    Abdominal Wall:         Not well visualized
Nuchal Fold:           Not applicable (>20    Cord Vessels:           Appears normal (3
wks GA)                                        vessel cord)
Face:                  Appears normal         Kidneys:                Appear normal
(orbits and profile)
Lips:                  Appears normal         Bladder:                Appears normal
Thoracic:              Appears normal         Spine:                  Appears normal
Heart:                 Not well visualized    Upper Extremities:      Visualized
RVOT:                  Not well visualized    Lower Extremities:      Visualized
LVOT:                  Not well visualized

Other:  Complete fetal anatomic survey previously performed. Technically
difficult due to maternal habitus and fetal position.
Cervix Uterus Adnexa

Cervix
Not visualized (advanced GA >45wks)

Uterus
No abnormality visualized.

Left Ovary
No adnexal mass visualized.

Right Ovary
No adnexal mass visualized.

Cul De Sac:   No free fluid seen.

Adnexa:       No abnormality visualized.
Impression

SIUP at 31+5 weeks
Normal detailed fetal anatomy; limited views of heart,
posterior fossa and CI
Normal amniotic fluid volume
Measurements consistent with LMP dating; EFW at the 51st
%tile
Recommendations

Follow-up ultrasound in 4 weeks to complete anatomy survey
and assess growth

## 2020-01-08 ENCOUNTER — Telehealth: Payer: Self-pay | Admitting: Obstetrics and Gynecology

## 2020-01-08 ENCOUNTER — Inpatient Hospital Stay (HOSPITAL_COMMUNITY)
Admission: AD | Admit: 2020-01-08 | Discharge: 2020-01-08 | Disposition: A | Discharge status: Home / Self Care | Payer: No Typology Code available for payment source | Attending: Family Medicine | Admitting: Family Medicine

## 2020-01-08 ENCOUNTER — Encounter (HOSPITAL_COMMUNITY): Payer: Self-pay | Admitting: Obstetrics and Gynecology

## 2020-01-08 ENCOUNTER — Other Ambulatory Visit: Payer: Self-pay

## 2020-01-08 DIAGNOSIS — N946 Dysmenorrhea, unspecified: Secondary | ICD-10-CM

## 2020-01-08 DIAGNOSIS — Z3202 Encounter for pregnancy test, result negative: Secondary | ICD-10-CM | POA: Insufficient documentation

## 2020-01-08 LAB — POCT PREGNANCY, URINE: Preg Test, Ur: NEGATIVE

## 2020-01-08 LAB — HCG, QUANTITATIVE, PREGNANCY: hCG, Beta Chain, Quant, S: 1 m[IU]/mL (ref <5)

## 2020-01-08 NOTE — MAU Provider Note (Signed)
First Provider Initiated Contact with Patient 01/08/20 1002      S Ms. Ashley Cisneros is a 24 y.o. G1P1001 patient who presents to MAU today with complaint of sore breasts, "feeling crazy" for 1 week, (+) HPT "taken with 1st morning urine; even though it had a faint line." She reports that around 1400 yesterday she started having mild cramping and started bleeding; filled up two pads. She states, "this is not like my usual period, because they are usually really heavy and I fill up 1 tampon an hour. I thought I'd better come get checked out since I had the (+) HPT, cramping and VB." She reports her LMP was November 7th or 8th.  O BP (!) 151/88 (BP Location: Right Arm)   Pulse (!) 102   Temp 98.7 F (37.1 C) (Oral)   Resp 18   Ht 5\' 4"  (1.626 m)   Wt (!) 138.7 kg   LMP 12/11/2019   SpO2 99%   BMI 52.47 kg/m    Physical Exam Vitals and nursing note reviewed.  Constitutional:      Appearance: Normal appearance. She is obese.  HENT:     Head: Normocephalic and atraumatic.  Cardiovascular:     Rate and Rhythm: Tachycardia present.  Pulmonary:     Effort: Pulmonary effort is normal.  Genitourinary:    Comments: Not examined Musculoskeletal:        General: Normal range of motion.     Cervical back: Normal range of motion.  Neurological:     Mental Status: She is alert and oriented to person, place, and time.  Psychiatric:        Mood and Affect: Mood normal.        Behavior: Behavior normal.        Thought Content: Thought content normal.        Judgment: Judgment normal.    Results for orders placed or performed during the hospital encounter of 01/08/20 (from the past 24 hour(s))  Pregnancy, urine POC     Status: None   Collection Time: 01/08/20  9:36 AM  Result Value Ref Range   Preg Test, Ur NEGATIVE NEGATIVE  hCG, quantitative, pregnancy     Status: None   Collection Time: 01/08/20 10:33 AM  Result Value Ref Range   hCG, Beta Chain, Quant, S 1 <5 mIU/mL    A Medical screening exam complete Negative pregnancy test - Plan: Discharge patient  Crampy pain associated with menses   P Discharge from MAU in stable condition Will call patient with results - cell number verified Discussed if HCG level is (+), will have patient go to [facility]  (prior OB office) for repeat HCG in 48 hours. Advised if HCG is (-), more than likely is the start of her period in the setting of the timing of the bleeding and cramping. Warning signs for worsening condition that would warrant emergency follow-up discussed Patient may return to MAU as needed  Laury Deep, CNM 01/08/2020 10:11 AM

## 2020-01-08 NOTE — MAU Note (Signed)
Yesterday morning, was feeling crazy, took a preg test, was +.  Around 1400 started bleeding and cramping.  Has filled 2 pads

## 2020-01-08 NOTE — Telephone Encounter (Signed)
TC to notify patient of negative HCG. DOB verified. Explained to patient that the false positive result she may have gotten could have been an expired pregnancy test or results read outside of the timeframe in which they are supposed to be read. Ok to treat pain as she normally would with Ibuprofen or any other NSAID. Patient verbalized an understanding of the plan of care and agrees.   Laury Deep, CNM
